# Patient Record
Sex: Female | Born: 1945 | Race: White | Hispanic: No | Marital: Married | Smoking: Never smoker
Health system: Southern US, Community
[De-identification: ages and names within clinical notes are randomized; demographics above are authoritative.]

---

## 1998-04-02 ENCOUNTER — Other Ambulatory Visit: Admission: RE | Admit: 1998-04-02 | Discharge: 1998-04-02 | Payer: Self-pay | Admitting: *Deleted

## 1999-07-09 ENCOUNTER — Other Ambulatory Visit: Admission: RE | Admit: 1999-07-09 | Discharge: 1999-07-09 | Payer: Self-pay | Admitting: *Deleted

## 2000-11-28 ENCOUNTER — Emergency Department (HOSPITAL_COMMUNITY): Admission: EM | Admit: 2000-11-28 | Discharge: 2000-11-28 | Payer: Self-pay | Admitting: Emergency Medicine

## 2001-02-25 ENCOUNTER — Other Ambulatory Visit: Admission: RE | Admit: 2001-02-25 | Discharge: 2001-02-25 | Payer: Self-pay | Admitting: *Deleted

## 2001-07-14 ENCOUNTER — Encounter: Payer: Self-pay | Admitting: *Deleted

## 2001-07-14 ENCOUNTER — Encounter: Admission: RE | Admit: 2001-07-14 | Discharge: 2001-07-14 | Payer: Self-pay | Admitting: *Deleted

## 2002-03-07 ENCOUNTER — Other Ambulatory Visit: Admission: RE | Admit: 2002-03-07 | Discharge: 2002-03-07 | Payer: Self-pay | Admitting: Obstetrics and Gynecology

## 2002-10-27 ENCOUNTER — Emergency Department (HOSPITAL_COMMUNITY): Admission: EM | Admit: 2002-10-27 | Discharge: 2002-10-27 | Payer: Self-pay | Admitting: Emergency Medicine

## 2002-10-27 ENCOUNTER — Encounter: Payer: Self-pay | Admitting: Emergency Medicine

## 2002-10-27 ENCOUNTER — Inpatient Hospital Stay (HOSPITAL_COMMUNITY): Admission: EM | Admit: 2002-10-27 | Discharge: 2002-11-07 | Payer: Self-pay | Admitting: *Deleted

## 2002-11-02 ENCOUNTER — Ambulatory Visit (HOSPITAL_COMMUNITY): Admission: RE | Admit: 2002-11-02 | Discharge: 2002-11-02 | Payer: Self-pay | Admitting: *Deleted

## 2002-11-02 ENCOUNTER — Encounter: Payer: Self-pay | Admitting: *Deleted

## 2002-11-05 ENCOUNTER — Emergency Department (HOSPITAL_COMMUNITY): Admission: EM | Admit: 2002-11-05 | Discharge: 2002-11-05 | Payer: Self-pay | Admitting: Emergency Medicine

## 2002-11-05 ENCOUNTER — Encounter: Payer: Self-pay | Admitting: Emergency Medicine

## 2003-11-06 ENCOUNTER — Other Ambulatory Visit: Admission: RE | Admit: 2003-11-06 | Discharge: 2003-11-06 | Payer: Self-pay | Admitting: Obstetrics and Gynecology

## 2005-02-10 ENCOUNTER — Other Ambulatory Visit: Admission: RE | Admit: 2005-02-10 | Discharge: 2005-02-10 | Payer: Self-pay | Admitting: Obstetrics and Gynecology

## 2005-05-29 ENCOUNTER — Ambulatory Visit: Payer: Self-pay | Admitting: Internal Medicine

## 2005-06-15 ENCOUNTER — Ambulatory Visit: Payer: Self-pay | Admitting: Internal Medicine

## 2015-06-26 ENCOUNTER — Encounter: Payer: Self-pay | Admitting: Gastroenterology

## 2016-06-01 ENCOUNTER — Other Ambulatory Visit: Payer: Self-pay | Admitting: Obstetrics and Gynecology

## 2016-06-01 DIAGNOSIS — M8580 Other specified disorders of bone density and structure, unspecified site: Secondary | ICD-10-CM

## 2016-06-08 LAB — HM PAP SMEAR: HM Pap smear: NORMAL

## 2016-06-09 LAB — COLOGUARD

## 2016-06-18 ENCOUNTER — Ambulatory Visit (INDEPENDENT_AMBULATORY_CARE_PROVIDER_SITE_OTHER): Payer: Medicare Other | Admitting: Family Medicine

## 2016-06-18 VITALS — BP 132/80 | HR 81 | Wt 162.0 lb

## 2016-06-18 DIAGNOSIS — Z7689 Persons encountering health services in other specified circumstances: Secondary | ICD-10-CM | POA: Diagnosis not present

## 2016-06-18 DIAGNOSIS — Z23 Encounter for immunization: Secondary | ICD-10-CM | POA: Diagnosis not present

## 2016-06-18 NOTE — Progress Notes (Signed)
Name: Catherine AltMary L Glover   MRN: [id number]    DOB: 10/17/1945   Date:06/18/2016       Progress Note  Subjective  Chief Complaint  Chief Complaint  Patient presents with  . Establish Care    wants lipids    Patient presents for establishing care.    No problem-specific Assessment & Plan notes found for this encounter.   No past medical history on file.  No past surgical history on file.  No family history on file.  Social History   Social History  . Marital status: Married    Spouse name: N/A  . Number of children: N/A  . Years of education: N/A   Occupational History  . Not on file.   Social History Main Topics  . Smoking status: Not on file  . Smokeless tobacco: Not on file  . Alcohol use Not on file  . Drug use: Unknown  . Sexual activity: Not on file   Other Topics Concern  . Not on file   Social History Narrative  . No narrative on file    No Known Allergies   Review of Systems  Constitutional: Negative for chills, fever, malaise/fatigue and weight loss.  HENT: Negative for congestion, ear discharge, ear pain, hearing loss, nosebleeds, sore throat and tinnitus.   Eyes: Positive for blurred vision. Negative for double vision, photophobia, pain, discharge and redness.  Respiratory: Positive for cough. Negative for hemoptysis, sputum production, shortness of breath and wheezing.   Cardiovascular: Negative for chest pain, palpitations, orthopnea, claudication, leg swelling and PND.  Gastrointestinal: Negative for abdominal pain, blood in stool, constipation, diarrhea, heartburn, melena, nausea and vomiting.  Genitourinary: Negative for dysuria, frequency, hematuria and urgency.  Musculoskeletal: Positive for joint pain. Negative for back pain, myalgias and neck pain.       Left knee/ injection  Skin: Negative for rash.       [facility]  Neurological: Negative for dizziness, tingling, sensory change, focal weakness and headaches.   Endo/Heme/Allergies: Negative for environmental allergies and polydipsia. Does not bruise/bleed easily.  Psychiatric/Behavioral: Negative for depression and suicidal ideas. The patient is not nervous/anxious and does not have insomnia.      Objective  Vitals:   06/18/16 1451  BP: 132/80  Pulse: 81  Weight: 162 lb (73.5 kg)    Physical Exam  Constitutional: She is well-developed, well-nourished, and in no distress. No distress.  HENT:  Head: Normocephalic and atraumatic.  Right Ear: External ear normal.  Left Ear: External ear normal.  Nose: Nose normal.  Mouth/Throat: Oropharynx is clear and moist.  Eyes: Conjunctivae and EOM are normal. Pupils are equal, round, and reactive to light. Right eye exhibits no discharge. Left eye exhibits no discharge.  Neck: Normal range of motion. Neck supple. No JVD present. No thyromegaly present.  Cardiovascular: Normal rate, regular rhythm, normal heart sounds and intact distal pulses.  Exam reveals no gallop and no friction rub.   No murmur heard. Pulmonary/Chest: Effort normal and breath sounds normal. She has no wheezes. She has no rales.  Abdominal: Soft. Bowel sounds are normal. She exhibits no distension and no mass. There is no tenderness. There is no rebound and no guarding.  Musculoskeletal: Normal range of motion. She exhibits no edema, tenderness or deformity.  Lymphadenopathy:    She has no cervical adenopathy.  Neurological: She is alert. She has normal reflexes.  Skin: Skin is warm and dry. She is not diaphoretic.  Psychiatric: Mood and affect normal.  Assessment & Plan  Problem List Items Addressed This Visit    None    Visit Diagnoses    Encounter to establish care    -  Primary   release of info /[facility]   Immunization due       Flu vaccine need       Relevant Orders   Flu Vaccine QUAD 36+ mos PF IM (Fluarix & Fluzone Quad PF) (Completed)    more than 40 minutes was spent with patient in which 25% of the  time was spent counseling about medical history and pre op clearance needs   Dr. Hayden [facility]  06/18/16

## 2016-06-23 ENCOUNTER — Other Ambulatory Visit (INDEPENDENT_AMBULATORY_CARE_PROVIDER_SITE_OTHER): Payer: Medicare Other

## 2016-06-23 DIAGNOSIS — Z9114 Patient's other noncompliance with medication regimen: Secondary | ICD-10-CM

## 2016-06-23 DIAGNOSIS — Z23 Encounter for immunization: Secondary | ICD-10-CM

## 2016-06-24 LAB — RENAL FUNCTION PANEL
ALBUMIN: 4.6 g/dL (ref 3.5–4.8)
BUN/Creatinine Ratio: 20 (ref 12–28)
BUN: 17 mg/dL (ref 8–27)
CHLORIDE: 104 mmol/L (ref 96–106)
CO2: 24 mmol/L (ref 18–29)
Calcium: 9.3 mg/dL (ref 8.7–10.3)
Creatinine, Ser: 0.87 mg/dL (ref 0.57–1.00)
GFR, EST AFRICAN AMERICAN: 78 mL/min/{1.73_m2} (ref >59)
GFR, EST NON AFRICAN AMERICAN: 68 mL/min/{1.73_m2} (ref >59)
GLUCOSE: 98 mg/dL (ref 65–99)
PHOSPHORUS: 3.9 mg/dL (ref 2.5–4.5)
POTASSIUM: 4.4 mmol/L (ref 3.5–5.2)
SODIUM: 144 mmol/L (ref 134–144)

## 2016-06-24 LAB — LIPID PANEL
CHOL/HDL RATIO: 3.7 ratio (ref 0.0–4.4)
Cholesterol, Total: 286 mg/dL — ABNORMAL HIGH (ref 100–199)
HDL: 78 mg/dL (ref >39)
LDL Calculated: 188 mg/dL — ABNORMAL HIGH (ref 0–99)
Triglycerides: 100 mg/dL (ref 0–149)
VLDL Cholesterol Cal: 20 mg/dL (ref 5–40)

## 2016-07-06 ENCOUNTER — Ambulatory Visit: Payer: Medicare Other | Admitting: Family Medicine

## 2016-07-17 ENCOUNTER — Ambulatory Visit (INDEPENDENT_AMBULATORY_CARE_PROVIDER_SITE_OTHER): Payer: Medicare Other | Admitting: Family Medicine

## 2016-07-17 ENCOUNTER — Encounter: Payer: Self-pay | Admitting: Family Medicine

## 2016-07-17 VITALS — Ht 63.0 in | Wt 161.0 lb

## 2016-07-17 DIAGNOSIS — Z719 Counseling, unspecified: Secondary | ICD-10-CM

## 2016-07-17 NOTE — Progress Notes (Signed)
Went over labs and diet- not getting surgery now- pt didn't need to see doctor

## 2016-07-17 NOTE — Patient Instructions (Signed)

## 2016-08-28 ENCOUNTER — Ambulatory Visit
Admission: RE | Admit: 2016-08-28 | Discharge: 2016-08-28 | Disposition: A | Discharge status: Home / Self Care | Payer: Medicare Other | Source: Ambulatory Visit | Attending: Obstetrics and Gynecology | Admitting: Obstetrics and Gynecology

## 2016-08-28 DIAGNOSIS — M8580 Other specified disorders of bone density and structure, unspecified site: Secondary | ICD-10-CM
